# Patient Record
Sex: Male | Born: 1965 | Race: White | Hispanic: No | Marital: Single | Smoking: Current every day smoker
Health system: Southern US, Community
[De-identification: ages and names within clinical notes are randomized; demographics above are authoritative.]

## PROBLEM LIST (undated history)

## (undated) HISTORY — PX: OTHER SURGICAL HISTORY: SHX169

## (undated) HISTORY — PX: APPENDECTOMY: SHX54

---

## 1998-09-16 ENCOUNTER — Emergency Department (HOSPITAL_COMMUNITY): Admission: EM | Admit: 1998-09-16 | Discharge: 1998-09-16 | Payer: Self-pay | Admitting: *Deleted

## 1998-09-16 ENCOUNTER — Encounter: Payer: Self-pay | Admitting: *Deleted

## 2000-09-12 ENCOUNTER — Encounter: Payer: Self-pay | Admitting: Internal Medicine

## 2000-09-12 ENCOUNTER — Emergency Department (HOSPITAL_COMMUNITY): Admission: EM | Admit: 2000-09-12 | Discharge: 2000-09-12 | Payer: Self-pay | Admitting: Internal Medicine

## 2003-03-21 ENCOUNTER — Emergency Department (HOSPITAL_COMMUNITY): Admission: EM | Admit: 2003-03-21 | Discharge: 2003-03-21 | Payer: Self-pay | Admitting: Emergency Medicine

## 2003-12-10 ENCOUNTER — Emergency Department: Payer: Self-pay | Admitting: Emergency Medicine

## 2004-02-11 ENCOUNTER — Emergency Department (HOSPITAL_COMMUNITY): Admission: EM | Admit: 2004-02-11 | Discharge: 2004-02-11 | Payer: Self-pay | Admitting: Emergency Medicine

## 2004-02-16 ENCOUNTER — Emergency Department (HOSPITAL_COMMUNITY): Admission: EM | Admit: 2004-02-16 | Discharge: 2004-02-16 | Payer: Self-pay | Admitting: Emergency Medicine

## 2004-08-23 ENCOUNTER — Emergency Department (HOSPITAL_COMMUNITY): Admission: EM | Admit: 2004-08-23 | Discharge: 2004-08-23 | Payer: Self-pay | Admitting: Emergency Medicine

## 2004-09-28 ENCOUNTER — Emergency Department (HOSPITAL_COMMUNITY): Admission: EM | Admit: 2004-09-28 | Discharge: 2004-09-28 | Payer: Self-pay | Admitting: *Deleted

## 2008-04-12 ENCOUNTER — Emergency Department (HOSPITAL_COMMUNITY): Admission: EM | Admit: 2008-04-12 | Discharge: 2008-04-12 | Payer: Self-pay | Admitting: Emergency Medicine

## 2010-05-16 LAB — POCT CARDIAC MARKERS
CKMB, poc: <1 ng/mL — ABNORMAL LOW (ref 1.0–8.0)
Troponin i, poc: <0.05 ng/mL (ref 0.00–0.09)

## 2010-05-16 LAB — BASIC METABOLIC PANEL
Chloride: 106 mEq/L (ref 96–112)
Creatinine, Ser: 1.1 mg/dL (ref 0.4–1.5)
GFR calc Af Amer: >60 mL/min (ref >60)
Potassium: 3.5 mEq/L (ref 3.5–5.1)
Sodium: 139 mEq/L (ref 135–145)

## 2010-05-16 LAB — POCT I-STAT, CHEM 8
Chloride: 105 mEq/L (ref 96–112)
Creatinine, Ser: 0.8 mg/dL (ref 0.4–1.5)
Glucose, Bld: 90 mg/dL (ref 70–99)
Potassium: 3.4 mEq/L — ABNORMAL LOW (ref 3.5–5.1)

## 2010-05-16 LAB — DIFFERENTIAL
Eosinophils Absolute: 0.1 10*3/uL (ref 0.0–0.7)
Lymphs Abs: 2.9 10*3/uL (ref 0.7–4.0)
Monocytes Relative: 9 % (ref 3–12)
Neutro Abs: 4 10*3/uL (ref 1.7–7.7)
Neutrophils Relative %: 52 % (ref 43–77)

## 2010-05-16 LAB — CBC
MCV: 94.6 fL (ref 78.0–100.0)
RBC: 4.83 MIL/uL (ref 4.22–5.81)
WBC: 7.7 10*3/uL (ref 4.0–10.5)

## 2013-06-21 ENCOUNTER — Ambulatory Visit: Payer: Self-pay | Admitting: Emergency Medicine

## 2015-05-08 ENCOUNTER — Emergency Department (HOSPITAL_COMMUNITY)
Admission: EM | Admit: 2015-05-08 | Discharge: 2015-05-08 | Disposition: A | Discharge status: Home / Self Care | Payer: Self-pay | Attending: Emergency Medicine | Admitting: Emergency Medicine

## 2015-05-08 ENCOUNTER — Encounter (HOSPITAL_COMMUNITY): Payer: Self-pay | Admitting: Emergency Medicine

## 2015-05-08 DIAGNOSIS — K0889 Other specified disorders of teeth and supporting structures: Secondary | ICD-10-CM | POA: Insufficient documentation

## 2015-05-08 DIAGNOSIS — F17200 Nicotine dependence, unspecified, uncomplicated: Secondary | ICD-10-CM | POA: Insufficient documentation

## 2015-05-08 DIAGNOSIS — Z791 Long term (current) use of non-steroidal anti-inflammatories (NSAID): Secondary | ICD-10-CM | POA: Insufficient documentation

## 2015-05-08 MED ORDER — AMOXICILLIN 500 MG PO CAPS: 500.0000 mg | ORAL_CAPSULE | Freq: Three times a day (TID) | ORAL | Status: AC

## 2015-05-08 MED ORDER — ACETAMINOPHEN 325 MG PO TABS
650.0000 mg | ORAL_TABLET | Freq: Once | ORAL | Status: AC
  Administered 2015-05-08: 650 mg via ORAL
  Filled 2015-05-08: qty 2

## 2015-05-08 MED ORDER — IBUPROFEN 800 MG PO TABS
800.0000 mg | ORAL_TABLET | Freq: Once | ORAL | Status: AC
  Administered 2015-05-08: 800 mg via ORAL
  Filled 2015-05-08: qty 1

## 2015-05-08 MED ORDER — PENICILLIN V POTASSIUM 250 MG PO TABS
500.0000 mg | ORAL_TABLET | Freq: Once | ORAL | Status: AC
Start: 2015-05-08 — End: 2015-05-08
  Administered 2015-05-08: 500 mg via ORAL
  Filled 2015-05-08: qty 2

## 2015-05-08 MED ORDER — IBUPROFEN 600 MG PO TABS: 600.0000 mg | ORAL_TABLET | Freq: Four times a day (QID) | ORAL | Status: AC | PRN

## 2015-05-08 NOTE — ED Notes (Signed)
Pt reports right upper dental pain since his dog head butted him in the mouth on saturday. Pt reports history of same. nad noted. Airway patent.

## 2015-05-08 NOTE — ED Provider Notes (Signed)
CSN: [id number]     Arrival date & time 05/08/15  1622 History   First MD Initiated Contact with Patient 05/08/15 1758     Chief Complaint  Patient presents with  . Dental Pain     (Consider location/radiation/quality/duration/timing/severity/associated sxs/prior Treatment) Patient is a 50 y.o. male presenting with tooth pain. The history is provided by the patient.  Dental Pain Location:  Upper Quality:  Aching and pulsating Severity:  Moderate Onset quality:  Gradual Duration:  3 days Timing:  Intermittent Progression:  Worsening Chronicity:  New Context comment:  Dental pain after pt's dog head butted him in the mouth. Relieved by:  Nothing Ineffective treatments:  None tried Associated symptoms: no drooling and no trismus   Risk factors: smoking   Risk factors: no diabetes     History reviewed. No pertinent past medical history. Past Surgical History  Procedure Laterality Date  . Kidney stone removal    . Appendectomy     History reviewed. No pertinent family history. Social History  Substance Use Topics  . Smoking status: Current Every Day Smoker -- 0.75 packs/day  . Smokeless tobacco: None  . Alcohol Use: No    Review of Systems  HENT: Positive for dental problem. Negative for drooling.   All other systems reviewed and are negative.     Allergies  Hydrocodone and Oxycodone  Home Medications   Prior to Admission medications   Medication Sig Start Date End Date Taking? Authorizing Provider  ibuprofen (ADVIL,MOTRIN) 200 MG tablet Take 400 mg by mouth once.   Yes Historical Provider, MD   BP 128/88 mmHg  Pulse 87  Temp(Src) 98.6 F (37 C) (Oral)  Resp 18  Ht 5\' 8"  (1.727 m)  Wt 79.379 kg  BMI 26.61 kg/m2  SpO2 98% Physical Exam  Constitutional: He is oriented to person, place, and time. He appears well-developed and well-nourished.  Non-toxic appearance.  HENT:  Head: Normocephalic.  Right Ear: Tympanic membrane and external ear normal.  Left  Ear: Tympanic membrane and external ear normal.  Patient has extensive dental caries of the upper and lower jaw. No dental abscess appreciated. The airway is patent. There is no trismus noted. No swelling under the tongue.  Eyes: EOM and lids are normal. Pupils are equal, round, and reactive to light.  Neck: Normal range of motion. Neck supple. Carotid bruit is not present.  Cardiovascular: Normal rate, regular rhythm, normal heart sounds, intact distal pulses and normal pulses.   Pulmonary/Chest: Breath sounds normal. No respiratory distress.  Abdominal: Soft. Bowel sounds are normal. There is no tenderness. There is no guarding.  Musculoskeletal: Normal range of motion.  Lymphadenopathy:       Head (right side): No submandibular adenopathy present.       Head (left side): No submandibular adenopathy present.    He has no cervical adenopathy.  Neurological: He is alert and oriented to person, place, and time. He has normal strength. No cranial nerve deficit or sensory deficit.  Skin: Skin is warm and dry.  Psychiatric: He has a normal mood and affect. His speech is normal.  Nursing note and vitals reviewed.   ED Course  Procedures (including critical care time) Labs Review Labs Reviewed - No data to display  Imaging Review No results found. I have personally reviewed and evaluated these images and lab results as part of my medical decision-making.   EKG Interpretation None      MDM  Vital signs are well within normal limits.  Vital signs reviewed. No acute or emergent changes appreciated. The patient will be treated with penicillin and ibuprofen. Patient strongly advised to see a dentist systems possible. There is no evidence for Ludwig's Angina, or other emergent changes at this time. Patient acknowledges understanding of the discharge instructions, and he is in agreement with this plan.    Final diagnoses:  Pain, dental    *I have reviewed nursing notes, vital signs, and  all appropriate lab and imaging results for this patient.**  8  Ivery Quale, PA-C 05/10/15 2100  Linwood Dibbles, MD 05/10/15 2110

## 2015-05-08 NOTE — Discharge Instructions (Signed)
There is swelling of the upper gum area, and tenderness with movement of the front teeth. It is extremely important that you see a dentist as soon as possible. Use amoxil three times daily with food until all taken, or you are seen by the dentist.

## 2017-04-04 ENCOUNTER — Emergency Department (HOSPITAL_COMMUNITY): Payer: BLUE CROSS/BLUE SHIELD

## 2017-04-04 ENCOUNTER — Emergency Department (HOSPITAL_COMMUNITY)
Admission: EM | Admit: 2017-04-04 | Discharge: 2017-04-05 | Discharge status: Against Medical Advice | Payer: BLUE CROSS/BLUE SHIELD | Attending: Emergency Medicine | Admitting: Emergency Medicine

## 2017-04-04 ENCOUNTER — Encounter (HOSPITAL_COMMUNITY): Payer: Self-pay | Admitting: Emergency Medicine

## 2017-04-04 DIAGNOSIS — F17210 Nicotine dependence, cigarettes, uncomplicated: Secondary | ICD-10-CM | POA: Diagnosis not present

## 2017-04-04 DIAGNOSIS — I493 Ventricular premature depolarization: Secondary | ICD-10-CM | POA: Insufficient documentation

## 2017-04-04 DIAGNOSIS — R42 Dizziness and giddiness: Secondary | ICD-10-CM | POA: Diagnosis not present

## 2017-04-04 DIAGNOSIS — R002 Palpitations: Secondary | ICD-10-CM | POA: Diagnosis present

## 2017-04-04 NOTE — ED Triage Notes (Signed)
Pt works as an Electrical engineerelectrician  States when he works a lot, he has feeling of arrhythmias and N  Reports no PCP

## 2017-04-04 NOTE — ED Provider Notes (Signed)
[facility] EMERGENCY DEPARTMENT Provider Note   CSN: [id number] Arrival date & time: 04/04/17  2203     History   Chief Complaint Chief Complaint  Patient presents with  . Nausea  . Irregular Heart Beat    HPI Jeremy Weaver is a 52 y.o. male.  Patient presents to the emergency department for evaluation of heart palpitations.  He reports that he frequently gets palpitations over the last 4-5 years.  He has been worked up including having an outpatient monitor but no arrhythmias were noted.  Tonight he has been having frequent episodes of palpitations that are associated with dizziness.  He became nauseated earlier but this has resolved.  He is not experiencing any chest pain.      History reviewed. No pertinent past medical history.  There are no active problems to display for this patient.   Past Surgical History:  Procedure Laterality Date  . APPENDECTOMY    . kidney stone removal         Home Medications    Prior to Admission medications   Medication Sig Start Date End Date Taking? Authorizing Provider  amoxicillin (AMOXIL) 500 MG capsule Take 1 capsule (500 mg total) by mouth 3 (three) times daily. 05/08/15   Ivery Quale, PA-C  ibuprofen (ADVIL,MOTRIN) 600 MG tablet Take 1 tablet (600 mg total) by mouth every 6 (six) hours as needed. 05/08/15   Ivery Quale, PA-C    Family History No family history on file.  Social History Social History   Tobacco Use  . Smoking status: Current Every Day Smoker    Packs/day: 0.75  . Smokeless tobacco: Never Used  Substance Use Topics  . Alcohol use: No  . Drug use: No     Allergies   Hydrocodone and Oxycodone   Review of Systems Review of Systems  Cardiovascular: Positive for palpitations.  Gastrointestinal: Positive for nausea.  Neurological: Positive for dizziness.  All other systems reviewed and are negative.    Physical Exam Updated Vital Signs BP 123/83   Pulse 74   Temp 98.1 F (36.7 C)  (Oral)   Resp 14   Ht 5\' 7"  (1.702 m)   Wt 81.6 kg (180 lb)   SpO2 95%   BMI 28.19 kg/m   Physical Exam  Constitutional: He is oriented to person, place, and time. He appears well-developed and well-nourished. No distress.  HENT:  Head: Normocephalic and atraumatic.  Right Ear: Hearing normal.  Left Ear: Hearing normal.  Nose: Nose normal.  Mouth/Throat: Oropharynx is clear and moist and mucous membranes are normal.  Eyes: Conjunctivae and EOM are normal. Pupils are equal, round, and reactive to light.  Neck: Normal range of motion. Neck supple.  Cardiovascular: Regular rhythm, S1 normal and S2 normal. Frequent extrasystoles (Correspond to his symptoms) are present. Exam reveals no gallop and no friction rub.  No murmur heard. Pulmonary/Chest: Effort normal and breath sounds normal. No respiratory distress. He exhibits no tenderness.  Abdominal: Soft. Normal appearance and bowel sounds are normal. There is no hepatosplenomegaly. There is no tenderness. There is no rebound, no guarding, no tenderness at McBurney's point and negative Murphy's sign. No hernia.  Musculoskeletal: Normal range of motion.  Neurological: He is alert and oriented to person, place, and time. He has normal strength. No cranial nerve deficit or sensory deficit. Coordination normal. GCS eye subscore is 4. GCS verbal subscore is 5. GCS motor subscore is 6.  Skin: Skin is warm, dry and intact. No rash noted.  No cyanosis.  Psychiatric: He has a normal mood and affect. His speech is normal and behavior is normal. Thought content normal.  Nursing note and vitals reviewed.    ED Treatments / Results  Labs (all labs ordered are listed, but only abnormal results are displayed) Labs Reviewed  BASIC METABOLIC PANEL  CBC    EKG  EKG Interpretation  Date/Time:  Saturday April 04 2017 23:10:57 EST Ventricular Rate:  77 PR Interval:    QRS Duration: 97 QT Interval:  380 QTC Calculation: 430 R Axis:   65 Text  Interpretation:  Sinus rhythm Normal ECG Confirmed by Gilda CreasePollina, Stetson Pelaez J [phone]([id number]) on 04/05/2017 12:05:03 AM       Radiology Dg Chest 2 View  Result Date: 04/04/2017 CLINICAL DATA:  Irregular heartbeat. EXAM: CHEST  2 VIEW COMPARISON:  Chest radiograph 04/12/2008 FINDINGS: Stable cardiac and mediastinal contours. No consolidative pulmonary opacities. No pleural effusion or pneumothorax. Regional skeleton is unremarkable. IMPRESSION: No acute cardiopulmonary process. Electronically Signed   By: Annia Beltrew  Davis M.D.   On: 04/04/2017 22:59    Procedures Procedures (including critical care time)  Medications Ordered in ED Medications - No data to display   Initial Impression / Assessment and Plan / ED Course  I have reviewed the triage vital signs and the nursing notes.  Pertinent labs & imaging results that were available during my care of the patient were reviewed by me and considered in my medical decision making (see chart for details).     Presents with symptomatic PVCs.  He was on the monitor while I was talking with him and every time a PVC occurred on the monitor, he reported that he had recurrence of his symptoms.  No other arrhythmias were noted.  His workup was entirely normal.  I discussed with him that he would need to follow-up with cardiology for long-term monitoring but that there were no serious findings tonight.  After this discussion, patient left the ER.  He did not wait to be discharged or for referral.  Final Clinical Impressions(s) / ED Diagnoses   Final diagnoses:  PVC (premature ventricular contraction)    ED Discharge Orders    None       Jacek Colson, Canary Brimhristopher J, MD 04/05/17 0006

## 2017-04-05 NOTE — ED Notes (Signed)
Pt was seen walking out of the room. This RN asked pt where he was he going and he stated, "home, I've got to work in the morning." Pt then walked out of the emergency department.

## 2020-01-07 IMAGING — DX DG CHEST 2V
2 series · 2 of 2 positions shown · non-contrast
Comparison: Chest radiograph 04/12/2008

CLINICAL DATA: Irregular heartbeat.

EXAM:
CHEST  2 VIEW

[chest pa]
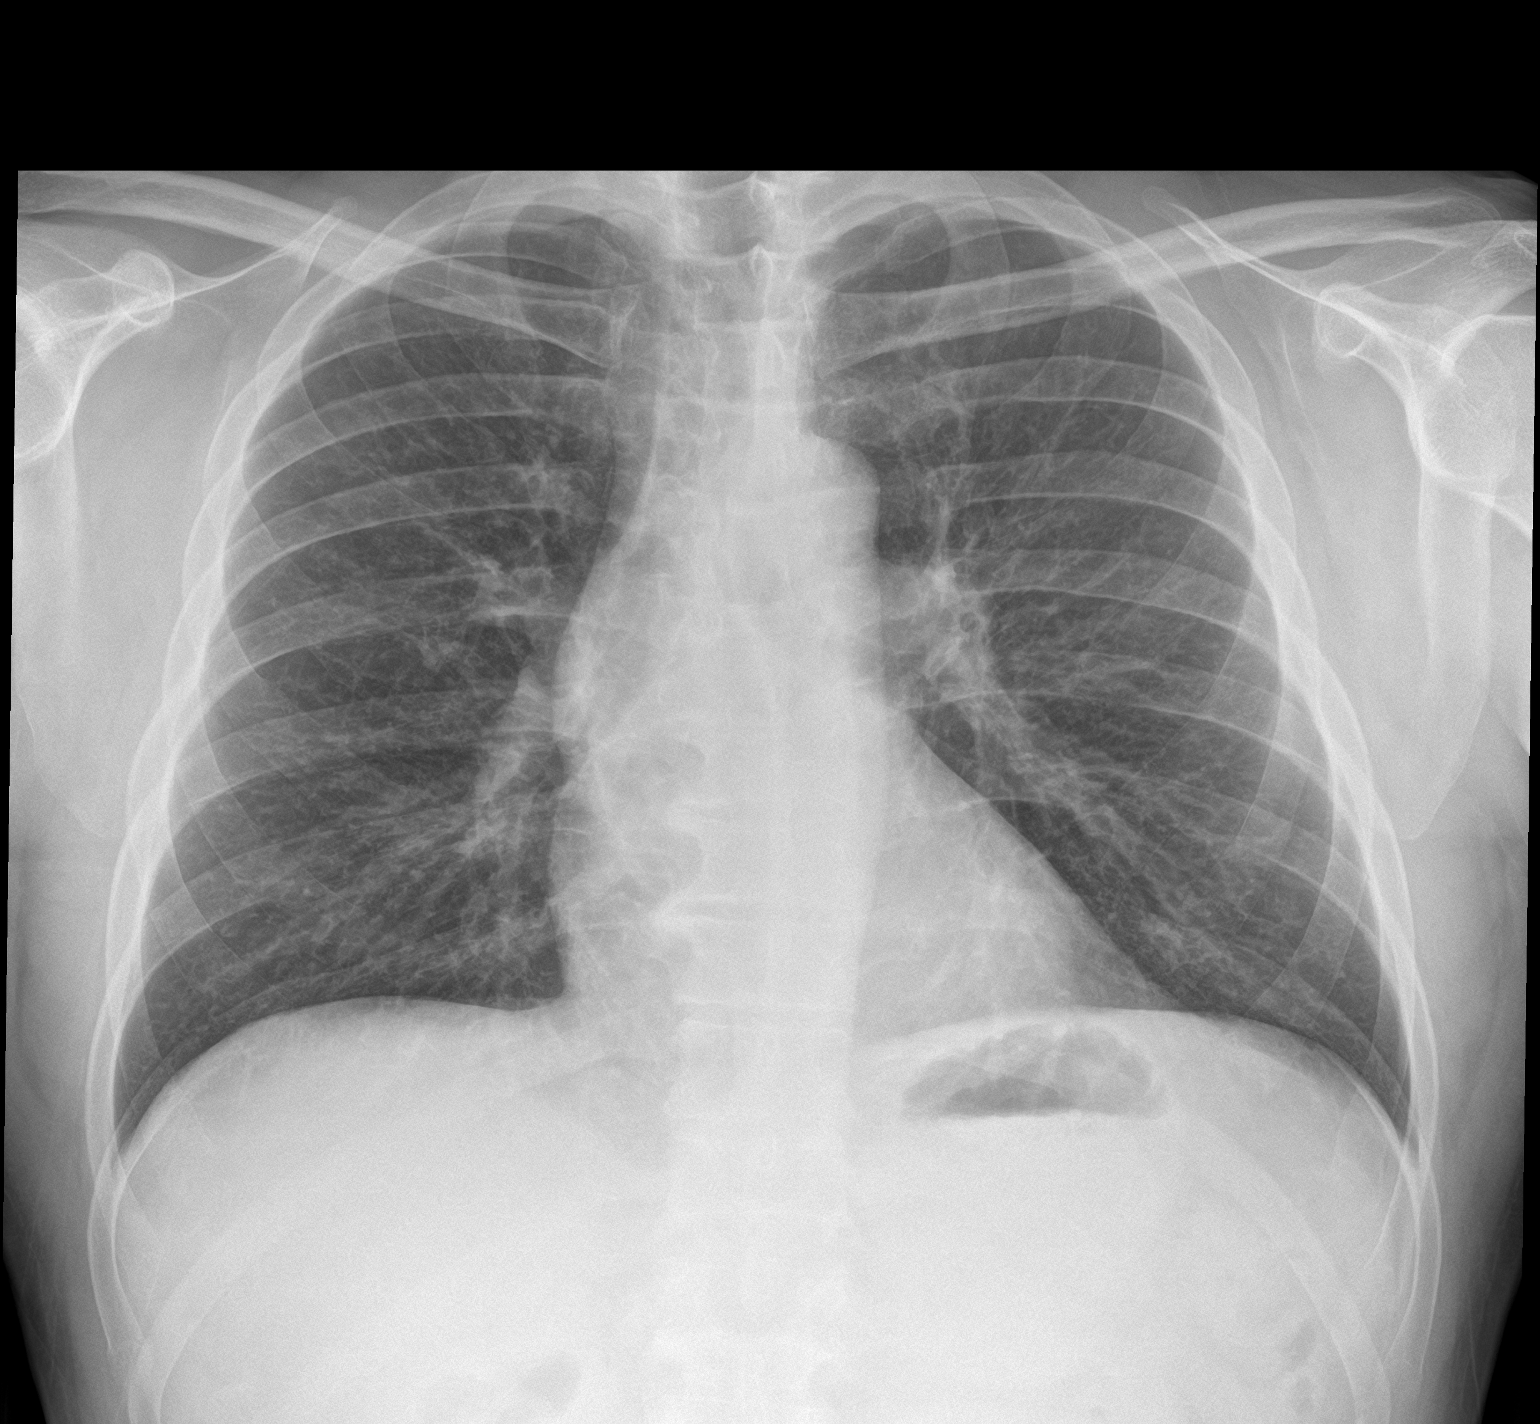

[chest lat]
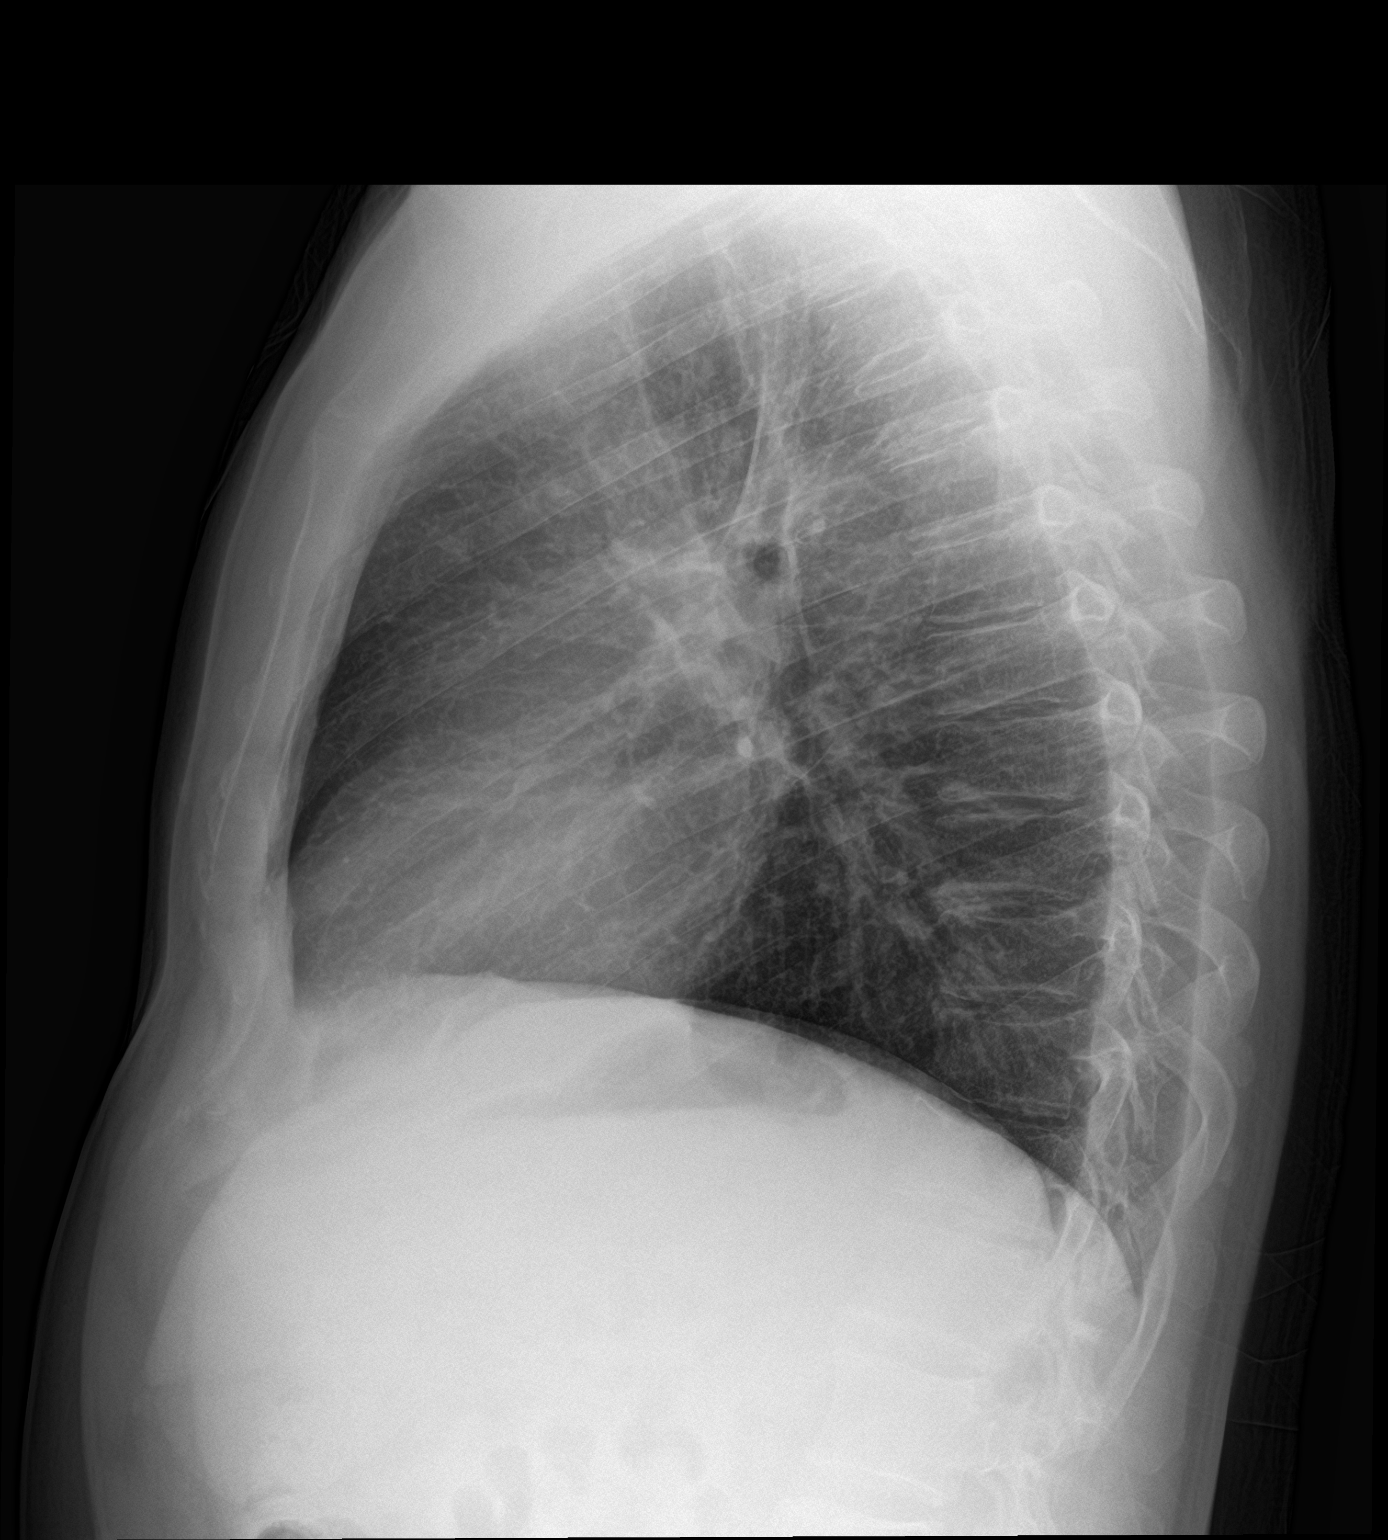

[2 of 2 positions shown; findings below may reference images not displayed]

FINDINGS: Stable cardiac and mediastinal contours. No consolidative pulmonary
opacities. No pleural effusion or pneumothorax. Regional skeleton is
unremarkable.
IMPRESSION: No acute cardiopulmonary process.
